# Patient Record
Sex: Female | Born: 1965 | Race: White | Hispanic: No | Marital: Single | Smoking: Never smoker
Health system: Southern US, Community
[De-identification: ages and names within clinical notes are randomized; demographics above are authoritative.]

## PROBLEM LIST (undated history)

## (undated) DIAGNOSIS — E079 Disorder of thyroid, unspecified: Secondary | ICD-10-CM

## (undated) DIAGNOSIS — M1990 Unspecified osteoarthritis, unspecified site: Secondary | ICD-10-CM

## (undated) DIAGNOSIS — K861 Other chronic pancreatitis: Secondary | ICD-10-CM

## (undated) DIAGNOSIS — Q059 Spina bifida, unspecified: Secondary | ICD-10-CM

## (undated) DIAGNOSIS — J45909 Unspecified asthma, uncomplicated: Secondary | ICD-10-CM

## (undated) DIAGNOSIS — K219 Gastro-esophageal reflux disease without esophagitis: Secondary | ICD-10-CM

---

## 2016-12-14 ENCOUNTER — Emergency Department
Admission: EM | Admit: 2016-12-14 | Discharge: 2016-12-14 | Disposition: A | Discharge status: Home / Self Care | Payer: Medicare Other | Attending: Emergency Medicine | Admitting: Emergency Medicine

## 2016-12-14 ENCOUNTER — Encounter: Payer: Self-pay | Admitting: Emergency Medicine

## 2016-12-14 ENCOUNTER — Emergency Department: Payer: Medicare Other

## 2016-12-14 DIAGNOSIS — M791 Myalgia: Secondary | ICD-10-CM | POA: Insufficient documentation

## 2016-12-14 DIAGNOSIS — J45909 Unspecified asthma, uncomplicated: Secondary | ICD-10-CM | POA: Diagnosis not present

## 2016-12-14 DIAGNOSIS — Q059 Spina bifida, unspecified: Secondary | ICD-10-CM | POA: Insufficient documentation

## 2016-12-14 DIAGNOSIS — Z91040 Latex allergy status: Secondary | ICD-10-CM | POA: Insufficient documentation

## 2016-12-14 DIAGNOSIS — R296 Repeated falls: Secondary | ICD-10-CM | POA: Diagnosis not present

## 2016-12-14 DIAGNOSIS — Z79899 Other long term (current) drug therapy: Secondary | ICD-10-CM | POA: Insufficient documentation

## 2016-12-14 DIAGNOSIS — T7411XA Adult physical abuse, confirmed, initial encounter: Secondary | ICD-10-CM | POA: Insufficient documentation

## 2016-12-14 DIAGNOSIS — N390 Urinary tract infection, site not specified: Secondary | ICD-10-CM | POA: Diagnosis not present

## 2016-12-14 DIAGNOSIS — F70 Mild intellectual disabilities: Secondary | ICD-10-CM | POA: Diagnosis not present

## 2016-12-14 HISTORY — DX: Unspecified osteoarthritis, unspecified site: M19.90

## 2016-12-14 HISTORY — DX: Spina bifida, unspecified: Q05.9

## 2016-12-14 HISTORY — DX: Other chronic pancreatitis: K86.1

## 2016-12-14 HISTORY — DX: Gastro-esophageal reflux disease without esophagitis: K21.9

## 2016-12-14 HISTORY — DX: Disorder of thyroid, unspecified: E07.9

## 2016-12-14 HISTORY — DX: Unspecified asthma, uncomplicated: J45.909

## 2016-12-14 LAB — URINALYSIS, COMPLETE (UACMP) WITH MICROSCOPIC
Bilirubin Urine: NEGATIVE
GLUCOSE, UA: NEGATIVE mg/dL
KETONES UR: NEGATIVE mg/dL
NITRITE: NEGATIVE
PROTEIN: NEGATIVE mg/dL
SQUAMOUS EPITHELIAL / LPF: NONE SEEN
Specific Gravity, Urine: 1.006 (ref 1.005–1.030)
pH: 7 (ref 5.0–8.0)

## 2016-12-14 LAB — CBC WITH DIFFERENTIAL/PLATELET
BASOS ABS: 0 10*3/uL (ref 0–0.1)
BASOS PCT: 1 %
EOS ABS: 0 10*3/uL (ref 0–0.7)
Eosinophils Relative: 0 %
HEMATOCRIT: 34.7 % — AB (ref 35.0–47.0)
HEMOGLOBIN: 11.9 g/dL — AB (ref 12.0–16.0)
Lymphocytes Relative: 20 %
Lymphs Abs: 1 10*3/uL (ref 1.0–3.6)
MCH: 29.9 pg (ref 26.0–34.0)
MCHC: 34.2 g/dL (ref 32.0–36.0)
MCV: 87.5 fL (ref 80.0–100.0)
Monocytes Absolute: 0.3 10*3/uL (ref 0.2–0.9)
Monocytes Relative: 6 %
NEUTROS PCT: 73 %
Neutro Abs: 3.6 10*3/uL (ref 1.4–6.5)
Platelets: 147 10*3/uL — ABNORMAL LOW (ref 150–440)
RBC: 3.96 MIL/uL (ref 3.80–5.20)
RDW: 13.9 % (ref 11.5–14.5)
WBC: 5 10*3/uL (ref 3.6–11.0)

## 2016-12-14 LAB — COMPREHENSIVE METABOLIC PANEL
ALBUMIN: 4.1 g/dL (ref 3.5–5.0)
ALK PHOS: 90 U/L (ref 38–126)
ALT: 19 U/L (ref 14–54)
ANION GAP: 6 (ref 5–15)
AST: 17 U/L (ref 15–41)
BILIRUBIN TOTAL: 0.5 mg/dL (ref 0.3–1.2)
BUN: 18 mg/dL (ref 6–20)
CALCIUM: 9 mg/dL (ref 8.9–10.3)
CO2: 23 mmol/L (ref 22–32)
Chloride: 110 mmol/L (ref 101–111)
Creatinine, Ser: 0.63 mg/dL (ref 0.44–1.00)
GFR calc Af Amer: >60 mL/min (ref >60)
GFR calc non Af Amer: >60 mL/min (ref >60)
GLUCOSE: 83 mg/dL (ref 65–99)
POTASSIUM: 4.3 mmol/L (ref 3.5–5.1)
SODIUM: 139 mmol/L (ref 135–145)
TOTAL PROTEIN: 7.5 g/dL (ref 6.5–8.1)

## 2016-12-14 LAB — CK: Total CK: 105 U/L (ref 38–234)

## 2016-12-14 MED ORDER — NITROFURANTOIN MONOHYD MACRO 100 MG PO CAPS: 100.0000 mg | ORAL_CAPSULE | Freq: Two times a day (BID) | ORAL | 0 refills | Status: AC

## 2016-12-14 MED ORDER — DEXTROSE 5 % IV SOLN
1.0000 g | Freq: Once | INTRAVENOUS | Status: AC
  Administered 2016-12-14: 1 g via INTRAVENOUS
  Filled 2016-12-14: qty 10

## 2016-12-14 MED ORDER — ACETAMINOPHEN 500 MG PO TABS
1000.0000 mg | ORAL_TABLET | Freq: Once | ORAL | Status: AC
  Administered 2016-12-14: 1000 mg via ORAL
  Filled 2016-12-14: qty 2

## 2016-12-14 NOTE — ED Notes (Signed)
DSS provider, [place], at bedside with patient.
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
## 2016-12-14 NOTE — ED Notes (Signed)
Tanya Johns  With Social work and [place]any with DSS have made a care plan with the patient that will allow her to go home with her sister and follow up for new placement from an outpatient standpoint.  DSS in room with patient to update her.
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
## 2016-12-14 NOTE — Clinical Social Work Note (Signed)
CSW informed by [place] with DSS APS that the patient's sister is returning to the hospital and that patient will be returning home with the sister this evening and they will work on placement from home. CSW has updated the patient's ED nurse on the plan provided by DSS APS. CSW signing off.  York SpanielMonica Marypat Kimmet MSW,LCSW [phone]
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
## 2016-12-14 NOTE — ED Notes (Signed)
Contacted social work, Insurance claims handlerMonica who says that she will come to ER and complete and assessment.
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
## 2016-12-16 LAB — URINE CULTURE

## 2017-03-14 ENCOUNTER — Ambulatory Visit: Payer: Medicare Other | Admitting: Podiatry

## 2017-03-21 ENCOUNTER — Emergency Department (HOSPITAL_COMMUNITY): Payer: Medicare Other

## 2017-03-21 ENCOUNTER — Encounter (HOSPITAL_COMMUNITY): Payer: Self-pay | Admitting: *Deleted

## 2017-03-21 ENCOUNTER — Emergency Department (HOSPITAL_COMMUNITY)
Admission: EM | Admit: 2017-03-21 | Discharge: 2017-03-22 | Disposition: A | Discharge status: Home / Self Care | Payer: Medicare Other | Attending: Emergency Medicine | Admitting: Emergency Medicine

## 2017-03-21 DIAGNOSIS — J45909 Unspecified asthma, uncomplicated: Secondary | ICD-10-CM | POA: Insufficient documentation

## 2017-03-21 DIAGNOSIS — Z79899 Other long term (current) drug therapy: Secondary | ICD-10-CM | POA: Insufficient documentation

## 2017-03-21 DIAGNOSIS — Q059 Spina bifida, unspecified: Secondary | ICD-10-CM | POA: Insufficient documentation

## 2017-03-21 DIAGNOSIS — R51 Headache: Secondary | ICD-10-CM | POA: Diagnosis not present

## 2017-03-21 DIAGNOSIS — J069 Acute upper respiratory infection, unspecified: Secondary | ICD-10-CM | POA: Insufficient documentation

## 2017-03-21 DIAGNOSIS — Z91040 Latex allergy status: Secondary | ICD-10-CM | POA: Diagnosis not present

## 2017-03-21 DIAGNOSIS — R079 Chest pain, unspecified: Secondary | ICD-10-CM | POA: Diagnosis present

## 2017-03-21 LAB — BASIC METABOLIC PANEL
ANION GAP: 10 (ref 5–15)
BUN: 33 mg/dL — ABNORMAL HIGH (ref 6–20)
CALCIUM: 8.8 mg/dL — AB (ref 8.9–10.3)
CO2: 24 mmol/L (ref 22–32)
Chloride: 104 mmol/L (ref 101–111)
Creatinine, Ser: 0.78 mg/dL (ref 0.44–1.00)
GFR calc Af Amer: >60 mL/min (ref >60)
GLUCOSE: 122 mg/dL — AB (ref 65–99)
POTASSIUM: 4.3 mmol/L (ref 3.5–5.1)
Sodium: 138 mmol/L (ref 135–145)

## 2017-03-21 LAB — TROPONIN I

## 2017-03-21 LAB — CBC
HCT: 34.9 % — ABNORMAL LOW (ref 36.0–46.0)
HEMOGLOBIN: 11.3 g/dL — AB (ref 12.0–15.0)
MCH: 29.7 pg (ref 26.0–34.0)
MCHC: 32.4 g/dL (ref 30.0–36.0)
MCV: 91.8 fL (ref 78.0–100.0)
PLATELETS: 160 10*3/uL (ref 150–400)
RBC: 3.8 MIL/uL — AB (ref 3.87–5.11)
RDW: 13.6 % (ref 11.5–15.5)
WBC: 7 10*3/uL (ref 4.0–10.5)

## 2017-03-21 MED ORDER — BENZONATATE 100 MG PO CAPS
100.0000 mg | ORAL_CAPSULE | Freq: Once | ORAL | Status: AC
  Administered 2017-03-21: 100 mg via ORAL
  Filled 2017-03-21: qty 1

## 2017-03-21 MED ORDER — BENZONATATE 100 MG PO CAPS: 100.0000 mg | ORAL_CAPSULE | Freq: Three times a day (TID) | ORAL | 0 refills | Status: AC

## 2017-03-21 MED ORDER — SODIUM CHLORIDE 0.9 % IV BOLUS (SEPSIS): 1000.0000 mL | Freq: Once | INTRAVENOUS | Status: DC

## 2017-03-21 MED ORDER — ACETAMINOPHEN 325 MG PO TABS
650.0000 mg | ORAL_TABLET | Freq: Once | ORAL | Status: AC
  Administered 2017-03-21: 650 mg via ORAL
  Filled 2017-03-21: qty 2

## 2017-03-21 NOTE — Discharge Instructions (Signed)
Use your Ventolin(albuterol) inhaler 2 puffs every 4 hours as needed for cough or shortness of breath. Take the cough medicine as prescribed. You can take Tylenol 650 mg every 4 hours as needed for aches or headache.Make sure that you drink at least six 8 ounce glasses of water or Gatorade each day in order to stay well-hydrated. Call your primary care physician to be seen if not feeling better in a week. Return if concern for any reason

## 2017-03-21 NOTE — ED Notes (Signed)
Patient transported to X-ray 

## 2017-03-21 NOTE — ED Triage Notes (Signed)
Pt woke up this morning with chest pain and pain in her back. She has been overall weaker than normal. Pt is alert and oriented. She lives at an assisted living facility. Over the last 2 weeks the patient has been having cold symptoms as well.

## 2017-03-21 NOTE — ED Provider Notes (Addendum)
[facility] DEPT Provider Note   CSN: [id number] Arrival date & time: 03/21/17  1509     History   Chief Complaint Chief Complaint  Patient presents with  . Chest Pain    HPI Tanya Johns is a 51 y.o. female.  HPI complains of generalized weakness cough productive of yellow sputum for one week and chest pain worse with cough and midthoracic back pain worse with cough for one week also reports diffuse headache for approximately week which is worse with coughing. She is uncertain if she's had a fever. She reports one episode of vomiting 2 days ago no diarrhea no abdominal pain. Other associated symptoms include nasal congestion no other associated symptoms. Treated with Tylenol without relief. She is presently hungry.   Past Medical History:  Diagnosis Date  . Arthritis   . Asthma   . Chronic pancreatitis (HCC)   . GERD (gastroesophageal reflux disease)   . Spina bifida (HCC)   . Thyroid disease     There are no active problems to display for this patient.  VP shunt since childhood History reviewed. No pertinent surgical history.  OB History    No data available       Home Medications    Prior to Admission medications   Medication Sig Start Date End Date Taking? Authorizing Provider  acetaminophen (TYLENOL) 500 MG tablet Take 2 tablets by mouth every 6 (six) hours as needed. 06/28/14   [provider]  albuterol (VENTOLIN HFA) 108 (90 Base) MCG/ACT inhaler Inhale 2 puffs into the lungs every 6 (six) hours as needed. 08/11/15   [provider]  busPIRone (BUSPAR) 10 MG tablet Take 3 tablets by mouth daily. 11/03/16   [provider]  cyclobenzaprine (FLEXERIL) 5 MG tablet Take 1 tablet by mouth 3 (three) times daily as needed. 10/19/16   [provider]  fluticasone (FLONASE) 50 MCG/ACT nasal spray Place 1-2 sprays into both nostrils daily. 02/12/15   [provider]  gabapentin (NEURONTIN) 600 MG tablet Take 1 tablet by  mouth 2 (two) times daily. 10/19/16   [provider]  hydrOXYzine (ATARAX/VISTARIL) 10 MG tablet Take 1 tablet by mouth daily as needed. 10/19/16   [provider]  levothyroxine (SYNTHROID, LEVOTHROID) 88 MCG tablet Take 1 tablet by mouth daily. 10/19/16   [provider]  loperamide (IMODIUM) 2 MG capsule Take 2 capsules by mouth 2 (two) times daily. 11/03/16   [provider]  omeprazole (PRILOSEC) 40 MG capsule Take 1 capsule by mouth 2 (two) times daily. 10/19/16   [provider]  potassium chloride (MICRO-K) 10 MEQ CR capsule Take 1 capsule by mouth 2 (two) times daily. 10/19/16   [provider]  venlafaxine XR (EFFEXOR-XR) 150 MG 24 hr capsule Take 2 capsules by mouth daily. 10/19/16   [provider]    Family History No family history on file.  Social History Social History  Substance Use Topics  . Smoking status: Never Smoker  . Smokeless tobacco: Never Used  . Alcohol use No     Allergies   Codeine; Latex; Penicillins; and Percocet [oxycodone-acetaminophen]   Review of Systems Review of Systems  Constitutional: Negative.   HENT: Positive for congestion.   Respiratory: Positive for cough.   Cardiovascular: Negative.   Gastrointestinal: Negative.   Musculoskeletal: Positive for gait problem.       Walks with walker  Skin: Negative.   Neurological: Positive for headaches.       Generalized weakness  Psychiatric/Behavioral: Negative.   All other systems reviewed and are negative.    Physical Exam Updated Vital Signs BP (!) 137/93 (BP Location: Left Arm)   Pulse (!) 112   Temp 97.9 F (36.6 C) (Oral)   Resp 18   Ht  (1.422 m)   SpO2 98%   Physical Exam  Constitutional: No distress.  Chronically ill-appearing  HENT:  Head: Normocephalic and atraumatic.  Mucous membranes dry  Eyes: Pupils are equal, round, and reactive to light. Conjunctivae are normal.  Neck: Neck supple. No tracheal deviation  present. No thyromegaly present.  Cardiovascular: Normal rate and regular rhythm.   No murmur heard. Pulmonary/Chest: Effort normal and breath sounds normal.  Abdominal: Soft. Bowel sounds are normal. She exhibits no distension. There is no tenderness.  Musculoskeletal: Normal range of motion. She exhibits no edema or tenderness.  Bilateral lower extremities with muscular atrophy. Entire spine nontender. All 4 extremities nontender. Neurovascular intact  Neurological: She is alert. Coordination normal.  Skin: Skin is warm and dry. No rash noted.  Psychiatric: She has a normal mood and affect.  Nursing note and vitals reviewed.    ED Treatments / Results  Labs (all labs ordered are listed, but only abnormal results are displayed) Labs Reviewed  BASIC METABOLIC PANEL - Abnormal; Notable for the following:       Result Value   Glucose, Bld 122 (*)    BUN 33 (*)    Calcium 8.8 (*)    All other components within normal limits  CBC - Abnormal; Notable for the following:    RBC 3.80 (*)    Hemoglobin 11.3 (*)    HCT 34.9 (*)    All other components within normal limits  TROPONIN I  X-rays viewed by me Results for orders placed or performed during the hospital encounter of 03/21/17  Basic metabolic panel  Result Value Ref Range   Sodium 138 135 - 145 mmol/L   Potassium 4.3 3.5 - 5.1 mmol/L   Chloride 104 101 - 111 mmol/L   CO2 24 22 - 32 mmol/L   Glucose, Bld 122 (H) 65 - 99 mg/dL   BUN 33 (H) 6 - 20 mg/dL   Creatinine, Ser 6.64 0.44 - 1.00 mg/dL   Calcium 8.8 (L) 8.9 - 10.3 mg/dL   GFR calc non Af Amer >60 >60 mL/min   GFR calc Af Amer >60 >60 mL/min   Anion gap 10 5 - 15  CBC  Result Value Ref Range   WBC 7.0 4.0 - 10.5 K/uL   RBC 3.80 (L) 3.87 - 5.11 MIL/uL   Hemoglobin 11.3 (L) 12.0 - 15.0 g/dL   HCT 40.3 (L) 47.4 - 25.9 %   MCV 91.8 78.0 - 100.0 fL   MCH 29.7 26.0 - 34.0 pg   MCHC 32.4 30.0 - 36.0 g/dL   RDW 56.3 87.5 - 64.3 %   Platelets 160 150 - 400 K/uL    Troponin I  Result Value Ref Range   Troponin I <0.03 <0.03 ng/mL   Dg Chest 2 View  Result Date: 03/21/2017 CLINICAL DATA:  51 year old female who awoke with chest pain and back pain today. Weakness and cough. EXAM: CHEST  2 VIEW COMPARISON:  Head CT 12/14/2016. FINDINGS: Left side ventriculoperitoneal shunt catheter coursing through the anterior chest to the abdomen. Mediastinal contours are normal aside from the possibility of a small gastric hiatal hernia. Lung volumes are within normal limits. No pneumothorax, pulmonary edema, pleural effusion or confluent  pulmonary opacity. Osteopenia and mild scoliosis. Asymmetric right first rib or cervical rib. No acute osseous abnormality identified. Stable cholecystectomy clips. Negative visible bowel gas pattern. IMPRESSION: No acute cardiopulmonary abnormality. Electronically Signed   By: Odessa Fleming M.D.   On: 03/21/2017 16:07   Dg Abd 1 View  Result Date: 03/21/2017 CLINICAL DATA:  Abdominal pain history of spina bifida EXAM: ABDOMEN - 1 VIEW COMPARISON:  Radiograph 03/21/2017 FINDINGS: Surgical clips in the right upper quadrant. Scoliosis of the spine. Numerous surgical sutures in the central and right lower abdomen. Shunt tubing in the left abdomen with tip terminating in the right pelvis. Spinal dysraphism at the lumbosacral spine. Nonobstructed gas pattern. IMPRESSION: 1. Tip of the shunt catheter is in the right hemi pelvis, visible portions over the abdomen are grossly intact 2. Nonobstructed gas pattern 3. Spinal dysraphism consistent with history of spina bifida Electronically Signed   By: Jasmine Pang M.D.   On: 03/21/2017 21:45   Ct Head Wo Contrast  Result Date: 03/21/2017 CLINICAL DATA:  Patient has been dizzy today. History of ventriculoperitoneal shunt. EXAM: CT HEAD WITHOUT CONTRAST TECHNIQUE: Contiguous axial images were obtained from the base of the skull through the vertex without intravenous contrast. COMPARISON:  12/14/2016. FINDINGS:  Brain: Dysgenesis/ agenesis of corpus callosum, with dorsal interhemispheric cyst, and dysplastic ventricles. There is a ventriculoperitoneal shunt catheter from LEFT parietal approach which appears satisfactory positioned within decompressed ventricles. This is unchanged from priors. 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 There is unusual dural calcification throughout. No acute infarct, hemorrhage, or significant white matter disease. Vascular: Calcification of the cavernous internal carotid arteries consistent with cerebrovascular atherosclerotic disease. No signs of intracranial large vessel occlusion. Skull: LEFT parietal burr hole for the current shunt catheter. Sequelae of previous RIGHT temporal approach. Sinuses/Orbits: Clear. Other: Shunt tubing appears intact. IMPRESSION: Stable exam. No acute intracranial findings, specifically no evidence for hydrocephalus/shunt failure. Dysgenesis/agenesis corpus callosum, as described above. Electronically Signed   By: Elsie Stain M.D.   On: 03/21/2017 21:59    EKG  EKG Interpretation  Date/Time:  Thursday March 21 2017 15:26:11 EDT Ventricular Rate:  107 PR Interval:  152 QRS Duration: 82 QT Interval:  340 QTC Calculation: 453 R Axis:   -39 Text Interpretation:  Sinus tachycardia Left axis deviation Minimal voltage criteria for LVH, may be normal variant Possible Lateral infarct , age undetermined Abnormal ECG No old tracing to compare Confirmed by Mancel Bale [phone]) on 03/21/2017 3:31:51 PM       Radiology Dg Chest 2 View  Result Date: 03/21/2017 CLINICAL DATA:  51 year old female who awoke with chest pain and back pain today. Weakness and cough. EXAM: CHEST  2 VIEW COMPARISON:  Head CT 12/14/2016. FINDINGS: Left side ventriculoperitoneal shunt catheter coursing through the anterior chest to the abdomen. Mediastinal contours are normal aside from the possibility of a small gastric hiatal hernia. Lung volumes are within normal  limits. No pneumothorax, pulmonary edema, pleural effusion or confluent pulmonary opacity. Osteopenia and mild scoliosis. Asymmetric right first rib or cervical rib. No acute osseous abnormality identified. Stable cholecystectomy clips. Negative visible bowel gas pattern. IMPRESSION: No acute cardiopulmonary abnormality. Electronically Signed   By: Odessa Fleming M.D.   On: 03/21/2017 16:07    Procedures Procedures (including critical care time)  Medications Ordered in ED Medications  acetaminophen (TYLENOL) tablet 650 mg (not administered)     Initial Impression / Assessment and Plan / ED Course  I have reviewed the triage vital signs  and the nursing notes.  Pertinent labs & imaging results that were available during my care of the patient were reviewed by me and considered in my medical decision making (see chart for details).     10:10 PM resting comfortably. Requesting cough medicine. CT scan of brain shunt series has ruled out shunt malfunction. Plan prescription Tessalon Perles. Encourage oral hydration. She is to use her albuterol inhaler 2 puffs every 4 hours as needed for cough or shortness of breath.Anemia is chronic Final Clinical Impressions(s) / ED Diagnoses  Diagnosis #1 Upper respiratory infection Final diagnoses:  None  #2 nonspecific headache  New Prescriptions New Prescriptions   No medications on file     Doug Sou, MD 03/21/17 2221    Doug Sou, MD 03/21/17 2225    Doug Sou, MD 03/21/17 2226    Doug Sou, MD 03/21/17 2226

## 2017-03-22 NOTE — ED Notes (Signed)
Pt placed in room 18. Pt lying on her left side watch television.

## 2017-03-22 NOTE — ED Notes (Signed)
Pt sleeping at this time. Equal chest rise and fall

## 2017-03-22 NOTE — ED Notes (Signed)
Pt sitting supine with eye open looking around the ER.
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
## 2017-03-22 NOTE — ED Notes (Signed)
Pt moved in to hallway 8 until EMS arrives for transport.

## 2017-03-22 NOTE — ED Notes (Signed)
Pt off unit via [facility] at this time

## 2017-04-04 ENCOUNTER — Ambulatory Visit: Payer: Medicare Other | Admitting: Podiatry

## 2019-02-14 IMAGING — DX DG CHEST 2V
2 series · 2 of 2 positions shown · non-contrast
Comparison: Head CT 12/14/2016.

CLINICAL DATA: 51-year-old female who awoke with chest pain and
back pain today. Weakness and cough.

EXAM:
CHEST  2 VIEW

[chest pa]
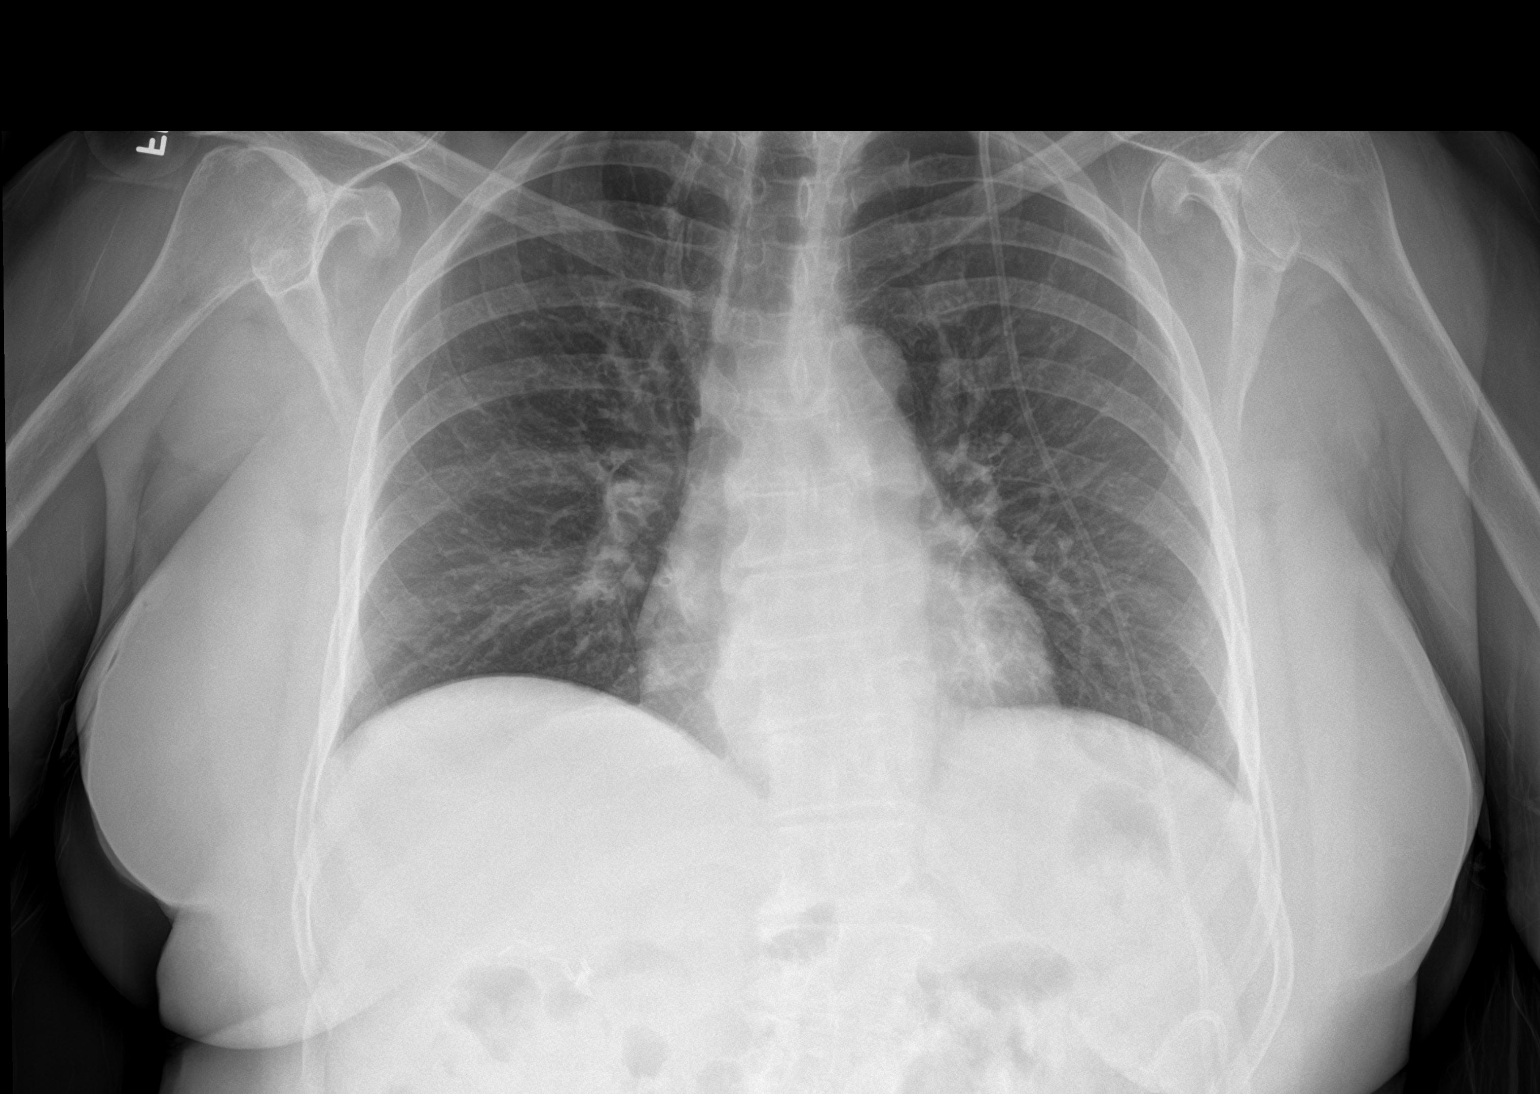

[chest lat]
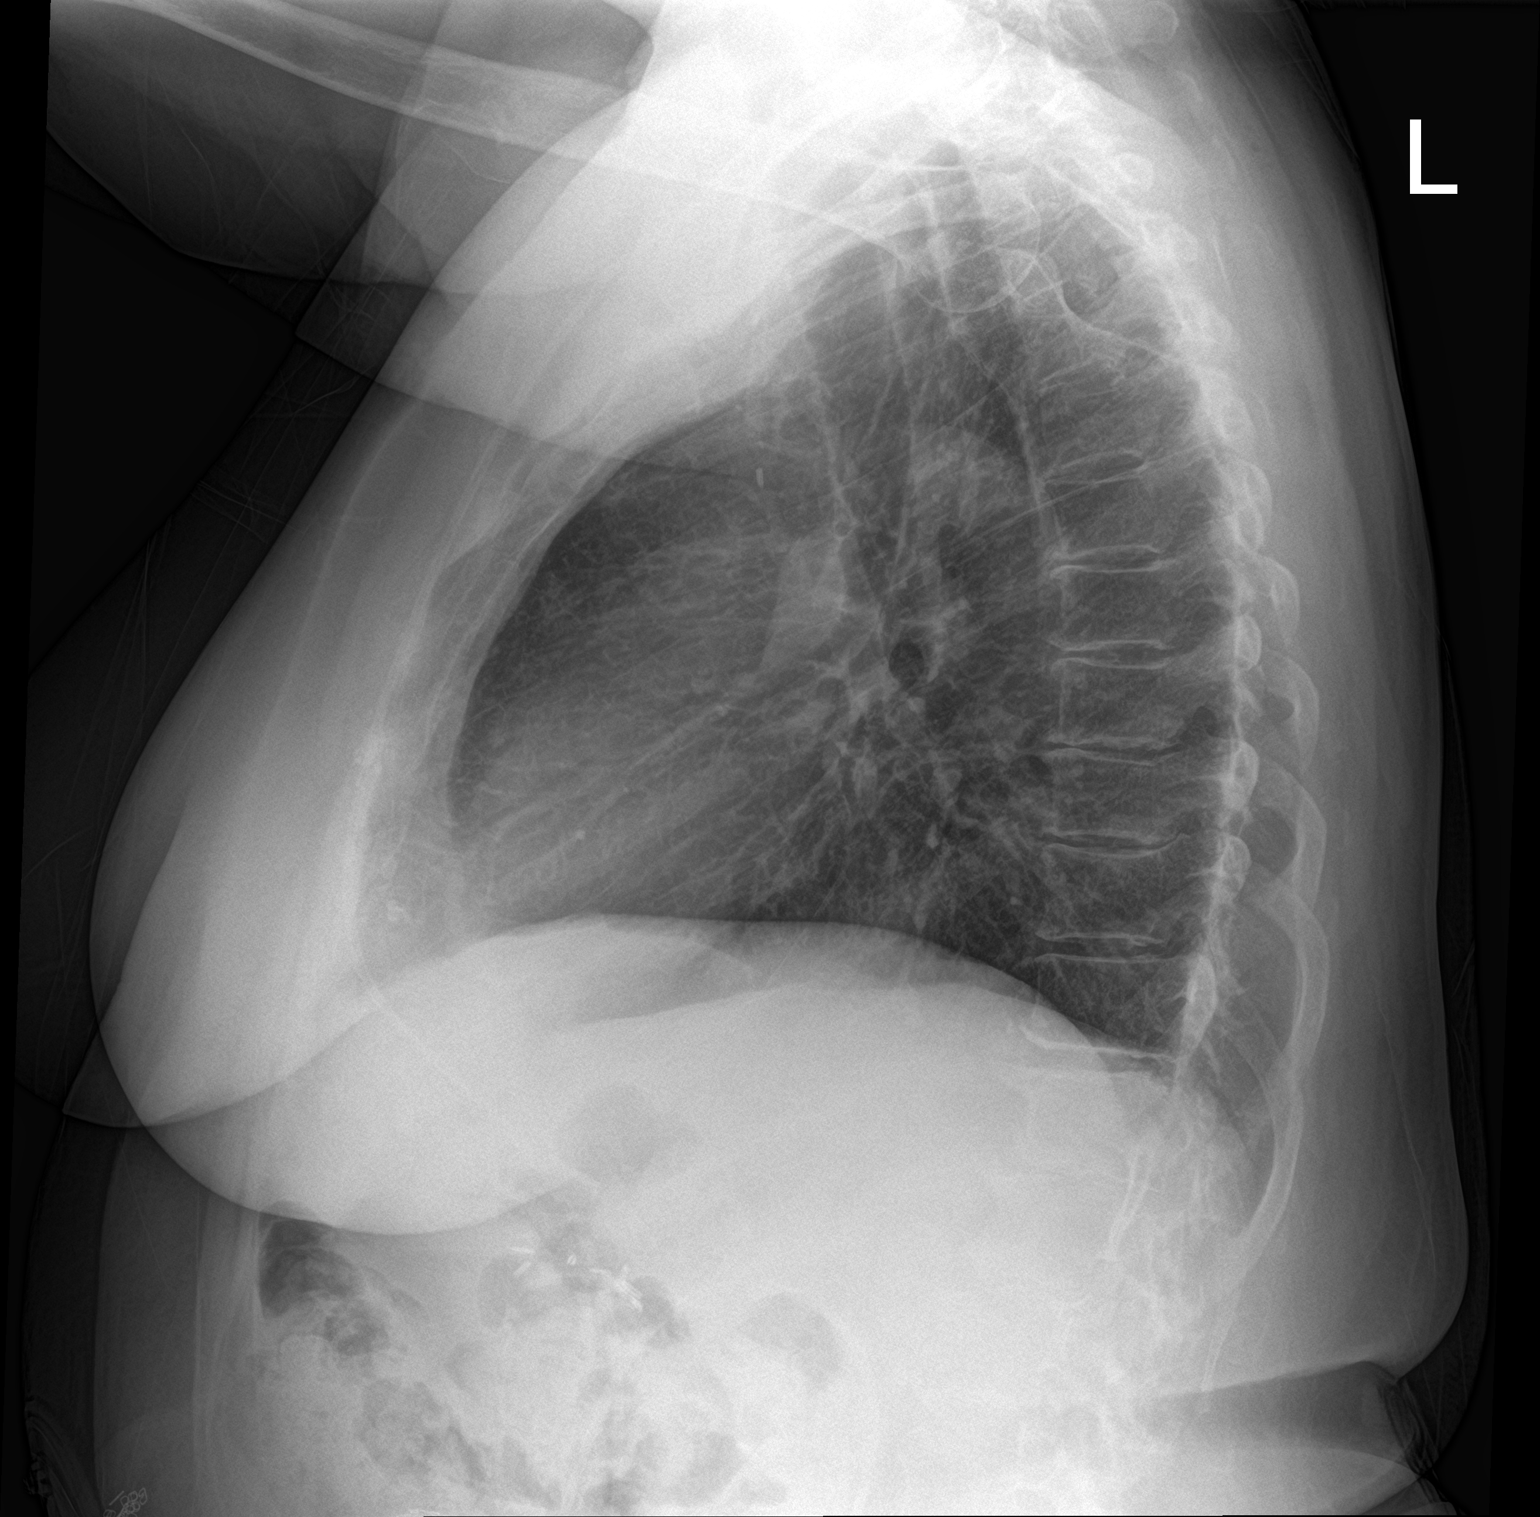

[2 of 2 positions shown; findings below may reference images not displayed]

FINDINGS: Left side ventriculoperitoneal shunt catheter coursing through the
anterior chest to the abdomen. Mediastinal contours are normal aside
from the possibility of a small gastric hiatal hernia. Lung volumes
are within normal limits. No pneumothorax, pulmonary edema, pleural
effusion or confluent pulmonary opacity. Osteopenia and mild
scoliosis. Asymmetric right first rib or cervical rib. No acute
osseous abnormality identified. Stable cholecystectomy clips.
Negative visible bowel gas pattern.
IMPRESSION: No acute cardiopulmonary abnormality.

## 2020-04-18 DEATH — deceased
